# Patient Record
Sex: Male | Born: 1990 | Race: White | Hispanic: No | Marital: Single | Smoking: Never smoker
Health system: Southern US, Community
[De-identification: ages and names within clinical notes are randomized; demographics above are authoritative.]

## PROBLEM LIST (undated history)

## (undated) HISTORY — PX: NO PAST SURGERIES: SHX2092

---

## 2008-01-08 ENCOUNTER — Emergency Department: Payer: Self-pay | Admitting: Emergency Medicine

## 2010-05-15 ENCOUNTER — Emergency Department (HOSPITAL_COMMUNITY): Payer: Self-pay

## 2010-05-15 ENCOUNTER — Encounter (HOSPITAL_COMMUNITY): Payer: Self-pay | Admitting: Radiology

## 2010-05-15 ENCOUNTER — Emergency Department (HOSPITAL_COMMUNITY)
Admission: EM | Admit: 2010-05-15 | Discharge: 2010-05-15 | Disposition: A | Discharge status: Home / Self Care | Payer: Worker's Compensation | Attending: Emergency Medicine | Admitting: Emergency Medicine

## 2010-05-15 DIAGNOSIS — M542 Cervicalgia: Secondary | ICD-10-CM | POA: Insufficient documentation

## 2010-05-15 DIAGNOSIS — IMO0002 Reserved for concepts with insufficient information to code with codable children: Secondary | ICD-10-CM | POA: Insufficient documentation

## 2010-05-15 DIAGNOSIS — M25519 Pain in unspecified shoulder: Secondary | ICD-10-CM | POA: Insufficient documentation

## 2010-05-15 DIAGNOSIS — R071 Chest pain on breathing: Secondary | ICD-10-CM | POA: Insufficient documentation

## 2010-05-15 DIAGNOSIS — Y929 Unspecified place or not applicable: Secondary | ICD-10-CM | POA: Insufficient documentation

## 2010-05-15 DIAGNOSIS — W138XXA Fall from, out of or through other building or structure, initial encounter: Secondary | ICD-10-CM | POA: Insufficient documentation

## 2010-05-15 DIAGNOSIS — R404 Transient alteration of awareness: Secondary | ICD-10-CM | POA: Insufficient documentation

## 2010-05-15 LAB — CBC
HCT: 47.1 % (ref 39.0–52.0)
MCHC: 34.8 g/dL (ref 30.0–36.0)
MCV: 87.7 fL (ref 78.0–100.0)
RDW: 12.6 % (ref 11.5–15.5)

## 2010-05-15 LAB — DIFFERENTIAL
Eosinophils Relative: 2 % (ref 0–5)
Lymphocytes Relative: 23 % (ref 12–46)
Lymphs Abs: 2.3 10*3/uL (ref 0.7–4.0)
Monocytes Absolute: 0.5 10*3/uL (ref 0.1–1.0)

## 2010-05-15 LAB — BASIC METABOLIC PANEL
BUN: 10 mg/dL (ref 6–23)
Calcium: 9.9 mg/dL (ref 8.4–10.5)
GFR calc non Af Amer: >60 mL/min (ref >60)
Glucose, Bld: 90 mg/dL (ref 70–99)

## 2014-02-02 ENCOUNTER — Emergency Department: Payer: Self-pay | Admitting: Emergency Medicine

## 2015-08-08 ENCOUNTER — Encounter: Payer: Self-pay | Admitting: Family Medicine

## 2015-08-08 ENCOUNTER — Ambulatory Visit (INDEPENDENT_AMBULATORY_CARE_PROVIDER_SITE_OTHER): Payer: Self-pay | Admitting: Family Medicine

## 2015-08-08 ENCOUNTER — Telehealth: Payer: Self-pay | Admitting: Family Medicine

## 2015-08-08 VITALS — BP 126/84 | HR 104 | Temp 99.5°F | Ht 71.0 in | Wt 261.8 lb

## 2015-08-08 DIAGNOSIS — J020 Streptococcal pharyngitis: Secondary | ICD-10-CM | POA: Insufficient documentation

## 2015-08-08 LAB — POCT RAPID STREP A (OFFICE): Rapid Strep A Screen: POSITIVE — AB

## 2015-08-08 MED ORDER — AMOXICILLIN-POT CLAVULANATE 875-125 MG PO TABS: 1.0000 | ORAL_TABLET | Freq: Two times a day (BID) | ORAL | Status: DC

## 2015-08-08 MED ORDER — ONDANSETRON 4 MG PO TBDP: 4.0000 mg | ORAL_TABLET | Freq: Three times a day (TID) | ORAL | Status: DC | PRN

## 2015-08-08 NOTE — Assessment & Plan Note (Signed)
New problem. Rapid strep positive today. Treating with Augmentin.

## 2015-08-08 NOTE — Telephone Encounter (Signed)
Please advise, thanks.

## 2015-08-08 NOTE — Progress Notes (Signed)
   Subjective:  Patient ID: Jeffrey Ball, male    DOB: July 24, 1990  Age: 25 y.o. MRN: [id number]  CC: ST, Runny nose, Cough, Headache   HPI Jeffrey NiemannGerald Clive Bonds Ball is a 25 y.o. male presents to the clinic today with the above complaints.  Patient states he has been sick for the past 2 weeks. Has been expressing runny nose, cough, headache, fever, decreased energy, and body aches. He has more recently been experiencing severe sore throat. He's also had recent vomiting. He's taken several over-the-counter medications no improvement. No known exacerbating factors. He's had no relief with the over-the-counter medications. No other complaints at this time.  PMH, Surgical Hx, Family Hx, Social History reviewed and updated as below.  No PMH per patient.  Past Surgical History  Procedure Laterality Date  . No past surgeries     Patient denies any family history.   Social History  Substance Use Topics  . Smoking status: Never Smoker   . Smokeless tobacco: Current User    Types: Chew  . Alcohol Use: 0.6 - 1.2 oz/week    1-2 Standard drinks or equivalent per week   Review of Systems  HENT: Positive for tinnitus, trouble swallowing and voice change.   Gastrointestinal: Positive for vomiting.  Endocrine:       Increased thirst.  Neurological: Positive for dizziness, weakness and headaches.  All other systems reviewed and are negative.  Objective:   Today's Vitals: BP 126/84 mmHg  Pulse 104  Temp(Src) 99.5 F (37.5 C) (Oral)  Ht 5\' 11"  (1.803 m)  Wt 261 lb 12 oz (118.729 kg)  BMI 36.52 kg/m2  SpO2 97%  Physical Exam  Constitutional: He is oriented to person, place, and time. He appears well-developed and well-nourished. No distress.  HENT:  Head: Normocephalic and atraumatic.  Nose: Nose normal.  Oropharynx with erythema. Tonsillar hypertrophy and exudate noted. Normal TM's bilaterally.   Eyes: Conjunctivae are normal. No scleral icterus.  Neck: Neck supple.    Cardiovascular: Normal rate and regular rhythm.   No murmur heard. Pulmonary/Chest: Effort normal and breath sounds normal. He has no wheezes. He has no rales.  Abdominal: Soft. He exhibits no distension. There is no tenderness. There is no rebound and no guarding.  Musculoskeletal: Normal range of motion. He exhibits no edema.  Neurological: He is alert and oriented to person, place, and time.  Skin: Skin is warm and dry. No rash noted.  Psychiatric: He has a normal mood and affect.  Vitals reviewed.  Assessment & Plan:   Problem List Items Addressed This Visit    Acute streptococcal pharyngitis - Primary    New problem. Rapid strep positive today. Treating with Augmentin.      Relevant Orders   POCT rapid strep A (Completed)      Outpatient Encounter Prescriptions as of 08/08/2015  Medication Sig  . amoxicillin-clavulanate (AUGMENTIN) 875-125 MG tablet Take 1 tablet by mouth 2 (two) times daily.  . ondansetron (ZOFRAN ODT) 4 MG disintegrating tablet Take 1 tablet (4 mg total) by mouth every 8 (eight) hours as needed for nausea or vomiting.   No facility-administered encounter medications on file as of 08/08/2015.    Follow-up: PRN  Everlene OtherJayce Clytie Shetley DO [facility] [place]

## 2015-08-08 NOTE — Telephone Encounter (Signed)
Patient gave verbal that it was okay to speak with dad. i let patient and dad know that a verbal order would be called into [facility] [address]

## 2015-08-08 NOTE — Progress Notes (Signed)
Pre visit review using our clinic review tool, if applicable. No additional management support is needed unless otherwise documented below in the visit note. 

## 2015-08-08 NOTE — Patient Instructions (Signed)
Take the antibiotic as prescribed.  Use the zofran as needed for nausea and vomiting.  Call if you worsen or fail to improve.  Take care  Dr. Adriana Simasook

## 2015-08-08 NOTE — Telephone Encounter (Signed)
Pt dad called in about pt Rx that was prescribed today if it can called into another pharmacy the price is too much. Pharmacy is [facility] [address] [place]. Call dad @ [phone]. Thank you!

## 2015-09-17 ENCOUNTER — Telehealth: Payer: Self-pay | Admitting: Family Medicine

## 2015-09-17 NOTE — Telephone Encounter (Signed)
Pt called about needing a refill for amoxicillin-clavulanate (AUGMENTIN) 875-125 MG tablet.    Pharmacy is [id number] - [place] - [address]  Call pt @ [phone]. Thank you!

## 2015-09-17 NOTE — Telephone Encounter (Signed)
Pt states he will call back once her check his schedule.

## 2015-09-17 NOTE — Telephone Encounter (Signed)
Patient will need an appt for a refill on antibiotics, please advise patient. thanks

## 2016-11-21 ENCOUNTER — Ambulatory Visit (INDEPENDENT_AMBULATORY_CARE_PROVIDER_SITE_OTHER): Payer: Self-pay | Admitting: Family Medicine

## 2016-11-21 ENCOUNTER — Encounter: Payer: Self-pay | Admitting: Family Medicine

## 2016-11-21 ENCOUNTER — Ambulatory Visit: Payer: Self-pay | Admitting: Family Medicine

## 2016-11-21 VITALS — BP 118/80 | HR 98 | Temp 100.6°F | Resp 16 | Wt 254.4 lb

## 2016-11-21 DIAGNOSIS — J029 Acute pharyngitis, unspecified: Secondary | ICD-10-CM | POA: Insufficient documentation

## 2016-11-21 DIAGNOSIS — K120 Recurrent oral aphthae: Secondary | ICD-10-CM | POA: Insufficient documentation

## 2016-11-21 LAB — POCT RAPID STREP A (OFFICE): RAPID STREP A SCREEN: NEGATIVE

## 2016-11-21 MED ORDER — CEFDINIR 300 MG PO CAPS: 300.0000 mg | ORAL_CAPSULE | Freq: Two times a day (BID) | ORAL | 0 refills | Status: DC

## 2016-11-21 MED ORDER — SULFURIC ACID-SULF PHENOLICS 30-50 % MT SOLN: OROMUCOSAL | 1 refills | Status: DC

## 2016-11-21 NOTE — Progress Notes (Signed)
Subjective:  Patient ID: Jeffrey Ball, male    DOB: 21-May-1990  Age: 26 y.o. MRN: [id number]  CC: Sore throat, fever  HPI:  26 year old male presents with the above complaints.  Patient states he been sick since Tuesday morning. He's had severe sore throat, fever, bodyaches, fatigue. He's also had canker sores (left lower gumline and cheek). No reported sick contacts. No known exacerbating or relieving factors. No medications or interventions tried. No other associated symptoms. No other complaints or concerns at this time.  Social Hx   Social History   Social History  . Marital status: Single    Spouse name: N/A  . Number of children: N/A  . Years of education: N/A   Social History Main Topics  . Smoking status: Never Smoker  . Smokeless tobacco: Current User    Types: Chew  . Alcohol use 0.6 - 1.2 oz/week    1 - 2 Standard drinks or equivalent per week  . Drug use: No  . Sexual activity: Yes   Other Topics Concern  . None   Social History Narrative  . None    Review of Systems  Constitutional: Positive for fever.  HENT: Positive for sore throat.   Respiratory: Negative for cough.    Objective:  BP 118/80 (BP Location: Left Arm, Patient Position: Sitting, Cuff Size: Large)   Pulse 98   Temp (!) 100.6 F (38.1 C) (Oral)   Resp 16   Wt 254 lb 6 oz (115.4 kg)   SpO2 98%   BMI 35.48 kg/m   BP/Weight 11/21/2016 08/08/2015  Systolic BP 118 126  Diastolic BP 80 84  Wt. (Lbs) 254.38 261.75  BMI 35.48 36.52    Physical Exam  Constitutional: He is oriented to person, place, and time. He appears well-developed. No distress.  HENT:  Head: Normocephalic and atraumatic.  Enlarged tonsils with severe erythema and exudate. Canker sores noted in the mouth.  Eyes: Conjunctivae are normal. No scleral icterus.  Neck: Neck supple.  Cardiovascular:  Tachycardia. Regular rhythm. No murmur.  Pulmonary/Chest: Effort normal. He has no wheezes. He has no rales.    Lymphadenopathy:    He has no cervical adenopathy.  Neurological: He is alert and oriented to person, place, and time.  Psychiatric: He has a normal mood and affect.  Vitals reviewed.   Lab Results  Component Value Date   WBC 10.1 05/15/2010   HGB 16.4 05/15/2010   HCT 47.1 05/15/2010   PLT 238 05/15/2010   GLUCOSE 90 05/15/2010   NA 141 05/15/2010   K 3.7 05/15/2010   CL 102 05/15/2010   CREATININE 1.15 05/15/2010   BUN 10 05/15/2010   CO2 28 05/15/2010    Assessment & Plan:   Problem List Items Addressed This Visit    Pharyngitis - Primary    New acute problem. Rapid strep negative. However, I clinically suspect strep pharyngitis given physical exam and history. Treating with Omnicef.      Relevant Orders   POCT rapid strep A (Completed)   Canker sore    Treating with Debacterol.          Meds ordered this encounter  Medications  . cefdinir (OMNICEF) 300 MG capsule    Sig: Take 1 capsule (300 mg total) by mouth 2 (two) times daily.    Dispense:  14 capsule    Refill:  0  . Sulfuric Acid-Sulf Phenolics 30-50 % SOLN    Sig: Use per package instructions.  Dispense:  3 each    Refill:  1     Follow-up: PRN  Everlene OtherJayce Angelisse Riso DO [facility] [place]

## 2016-11-21 NOTE — Patient Instructions (Signed)
Medication as prescribed.  Take care  Dr. Norine Reddington  

## 2016-11-21 NOTE — Assessment & Plan Note (Signed)
Treating with Debacterol.  ?

## 2016-11-21 NOTE — Assessment & Plan Note (Signed)
New acute problem. Rapid strep negative. However, I clinically suspect strep pharyngitis given physical exam and history. Treating with Omnicef.

## 2018-03-07 ENCOUNTER — Encounter: Payer: Self-pay | Admitting: Emergency Medicine

## 2018-03-07 ENCOUNTER — Emergency Department
Admission: EM | Admit: 2018-03-07 | Discharge: 2018-03-07 | Disposition: A | Discharge status: Home / Self Care | Payer: Self-pay | Attending: Emergency Medicine | Admitting: Emergency Medicine

## 2018-03-07 ENCOUNTER — Emergency Department: Payer: Self-pay

## 2018-03-07 ENCOUNTER — Other Ambulatory Visit: Payer: Self-pay

## 2018-03-07 DIAGNOSIS — M545 Low back pain, unspecified: Secondary | ICD-10-CM

## 2018-03-07 MED ORDER — DEXAMETHASONE SODIUM PHOSPHATE 10 MG/ML IJ SOLN
20.0000 mg | Freq: Once | INTRAMUSCULAR | Status: AC
  Administered 2018-03-07: 20 mg via INTRAMUSCULAR
  Filled 2018-03-07: qty 2

## 2018-03-07 MED ORDER — PREDNISONE 10 MG (21) PO TBPK: ORAL_TABLET | ORAL | 0 refills | Status: AC

## 2018-03-07 MED ORDER — METHOCARBAMOL 500 MG PO TABS
1000.0000 mg | ORAL_TABLET | Freq: Once | ORAL | Status: AC
  Administered 2018-03-07: 1000 mg via ORAL
  Filled 2018-03-07: qty 2

## 2018-03-07 MED ORDER — METHOCARBAMOL 500 MG PO TABS: 500.0000 mg | ORAL_TABLET | Freq: Three times a day (TID) | ORAL | 0 refills | Status: AC | PRN

## 2018-03-07 NOTE — ED Provider Notes (Signed)
[facility] Emergency Department Provider Note  ____________________________________________  Time seen: Approximately 10:25 PM  I have reviewed the triage vital signs and the nursing notes.   HISTORY  Chief Complaint Back Pain    HPI Jeffrey Ball is a 27 y.o. male presents to the emergency department with midline low back pain in the distribution of L4-L5.  Patient denies numbness or tingling in the lower extremities but reports that back pain of any kind is atypical for him.  He denies bowel or bladder incontinence or saddle anesthesia.  No new falls or traumas.  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  No dysuria, hematuria or increased urinary frequency.  No prior history of nephrolithiasis.  He denies fever or history of IV drug use.  Patient denies prior surgeries to the low back.  Patient has been taking Ibuprofen at home.   History reviewed. No pertinent past medical history.  Patient Active Problem List   Diagnosis Date Noted  . Pharyngitis 11/21/2016  . Canker sore 11/21/2016    Past Surgical History:  Procedure Laterality Date  . NO PAST SURGERIES      Prior to Admission medications   Medication Sig Start Date End Date Taking? Authorizing Provider  methocarbamol (ROBAXIN) 500 MG tablet Take 1 tablet (500 mg total) by mouth every 8 (eight) hours as needed for up to 5 days. 03/07/18 03/12/18  Orvil Feil, PA-C  predniSONE (STERAPRED UNI-PAK 21 TAB) 10 MG (21) TBPK tablet Take 6 tabs the the 1st day. Take 6 tabs the the 2nd day. Take 5 tabs the the 3rd day. Take 5 tabs the 4th day. Take 4 tabs the the 5th day.Take 4 tabs the the 6th day.Take 3 tabs the 7th day.Take 3 tabs the 8th day. Take 2 tabs the 9th day. Take 2 tabs the 10th day. Take 1 tab the 11th day. Take 1 tab the 12th day. 03/07/18   Orvil Feil, PA-C    Allergies Patient has no known allergies.  History reviewed. No pertinent family  history.  Social History Social History   Tobacco Use  . Smoking status: Never Smoker  . Smokeless tobacco: Current User    Types: Chew  Substance Use Topics  . Alcohol use: Not Currently    Alcohol/week: 1.0 - 2.0 standard drinks    Types: 1 - 2 Standard drinks or equivalent per week  . Drug use: No     Review of Systems  Constitutional: No fever/chills Eyes: No visual changes. No discharge ENT: No upper respiratory complaints. Cardiovascular: no chest pain. Respiratory: no cough. No SOB. Gastrointestinal: No abdominal pain.  No nausea, no vomiting.  No diarrhea.  No constipation. Genitourinary: Negative for dysuria. No hematuria Musculoskeletal: Patient has low back pain.   Skin: Negative for rash, abrasions, lacerations, ecchymosis. Neurological: Negative for headaches, focal weakness or numbness.  ___________________________________  PHYSICAL EXAM:  VITAL SIGNS: ED Triage Vitals  Enc Vitals Group     BP 03/07/18 2146 (!) 153/90     Pulse Rate 03/07/18 2146 97     Resp 03/07/18 2146 18     Temp 03/07/18 2146 98.7 F (37.1 C)     Temp Source 03/07/18 2146 Oral     SpO2 03/07/18 2146 99 %     Weight 03/07/18 2146 277 lb (125.6 kg)     Height 03/07/18 2146 6' (1.829 m)     Head Circumference --  Peak Flow --      Pain Score 03/07/18 2149 6     Pain Loc --      Pain Edu? --      Excl. in GC? --      Constitutional: Alert and oriented. Well appearing and in no acute distress. Eyes: Conjunctivae are normal. PERRL. EOMI. Head: Atraumatic. Cardiovascular: Normal rate, regular rhythm. Normal S1 and S2.  Good peripheral circulation. Respiratory: Normal respiratory effort without tachypnea or retractions. Lungs CTAB. Good air entry to the bases with no decreased or absent breath sounds. Musculoskeletal: Patient has 5 out of 5 strength in the lower extremities.  He does have midline lumbar spine tenderness at L4-L5.  Negative straight leg raise bilaterally.    Neurologic:  Normal speech and language. No gross focal neurologic deficits are appreciated.  Skin:  Skin is warm, dry and intact. No rash noted. Psychiatric: Mood and affect are normal. Speech and behavior are normal. Patient exhibits appropriate insight and judgement.   ____________________________________________   LABS (all labs ordered are listed, but only abnormal results are displayed)  Labs Reviewed - No data to display ____________________________________________  EKG   ____________________________________________  RADIOLOGY I personally viewed and evaluated these images as part of my medical decision making, as well as reviewing the written report by the radiologist.    Dg Lumbar Spine 2-3 Views  Result Date: 03/07/2018 CLINICAL DATA:  Midline low back pain.  No known injury. EXAM: LUMBAR SPINE - 2-3 VIEW COMPARISON:  None. FINDINGS: The alignment is maintained. Vertebral body heights are normal. There is no listhesis. The posterior elements are intact. Lumbar disc spaces are preserved. No fracture. Sacroiliac joints are symmetric and normal. IMPRESSION: Negative radiographs of lumbar spine. Electronically Signed   By: Narda RutherfordMelanie  Sanford M.D.   On: 03/07/2018 23:06    ____________________________________________    PROCEDURES  Procedure(s) performed:    Procedures    Medications  dexamethasone (DECADRON) injection 20 mg (20 mg Intramuscular Given 03/07/18 2301)  methocarbamol (ROBAXIN) tablet 1,000 mg (1,000 mg Oral Given 03/07/18 2300)     ____________________________________________   INITIAL IMPRESSION / ASSESSMENT AND PLAN / ED COURSE  Pertinent labs & imaging results that were available during my care of the patient were reviewed by me and considered in my medical decision making (see chart for details).  Review of the [place] CSRS was performed in accordance of the [facility] prior to dispensing any controlled drugs.      Assessment and Plan: Low Back  Pain:  Patient presents to the emergency department with midline low back pain for the past week.  X-ray examination reveals no acute bony abnormalities.  Neurologic exam and overall physical exam was reassuring.  Patient was given Decadron and Robaxin in the emergency department.  He was discharged with prednisone and Robaxin.  Vital signs are reassuring prior to discharge. All patient questions were answered.    ____________________________________________  FINAL CLINICAL IMPRESSION(S) / ED DIAGNOSES  Final diagnoses:  Acute bilateral low back pain without sciatica      NEW MEDICATIONS STARTED DURING THIS VISIT:  ED Discharge Orders         Ordered    predniSONE (STERAPRED UNI-PAK 21 TAB) 10 MG (21) TBPK tablet     03/07/18 2315    methocarbamol (ROBAXIN) 500 MG tablet  Every 8 hours PRN     03/07/18 2316              This chart was dictated using voice recognition  software/Dragon. Despite best efforts to proofread, errors can occur which can change the meaning. Any change was purely unintentional.    Orvil Feil, PA-C 03/07/18 Gwendlyn Deutscher, [place], MD 03/07/18 [phone]

## 2018-03-07 NOTE — ED Triage Notes (Signed)
Pt reports low back pain since Wednesday; denies injury; denies difficulty with bowel or bladder; stopped triage for pt to talk on the phone;

## 2018-05-13 ENCOUNTER — Emergency Department
Admission: EM | Admit: 2018-05-13 | Discharge: 2018-05-13 | Disposition: A | Discharge status: Home / Self Care | Payer: Self-pay | Attending: Emergency Medicine | Admitting: Emergency Medicine

## 2018-05-13 ENCOUNTER — Other Ambulatory Visit: Payer: Self-pay

## 2018-05-13 ENCOUNTER — Emergency Department: Payer: Self-pay

## 2018-05-13 ENCOUNTER — Encounter: Payer: Self-pay | Admitting: Emergency Medicine

## 2018-05-13 DIAGNOSIS — R112 Nausea with vomiting, unspecified: Secondary | ICD-10-CM

## 2018-05-13 DIAGNOSIS — R1084 Generalized abdominal pain: Secondary | ICD-10-CM

## 2018-05-13 DIAGNOSIS — R55 Syncope and collapse: Secondary | ICD-10-CM

## 2018-05-13 DIAGNOSIS — R42 Dizziness and giddiness: Secondary | ICD-10-CM | POA: Insufficient documentation

## 2018-05-13 DIAGNOSIS — R197 Diarrhea, unspecified: Secondary | ICD-10-CM | POA: Insufficient documentation

## 2018-05-13 DIAGNOSIS — R05 Cough: Secondary | ICD-10-CM | POA: Insufficient documentation

## 2018-05-13 DIAGNOSIS — F17220 Nicotine dependence, chewing tobacco, uncomplicated: Secondary | ICD-10-CM | POA: Insufficient documentation

## 2018-05-13 LAB — URINALYSIS, COMPLETE (UACMP) WITH MICROSCOPIC
BILIRUBIN URINE: NEGATIVE
Bacteria, UA: NONE SEEN
Glucose, UA: NEGATIVE mg/dL
HGB URINE DIPSTICK: NEGATIVE
KETONES UR: 5 mg/dL — AB
LEUKOCYTES UA: NEGATIVE
Nitrite: NEGATIVE
PH: 5 (ref 5.0–8.0)
Protein, ur: NEGATIVE mg/dL
SQUAMOUS EPITHELIAL / LPF: NONE SEEN (ref 0–5)
Specific Gravity, Urine: >1.046 — ABNORMAL HIGH (ref 1.005–1.030)

## 2018-05-13 LAB — COMPREHENSIVE METABOLIC PANEL
ALBUMIN: 5.1 g/dL — AB (ref 3.5–5.0)
ALT: 29 U/L (ref 0–44)
ANION GAP: 7 (ref 5–15)
AST: 25 U/L (ref 15–41)
Alkaline Phosphatase: 67 U/L (ref 38–126)
BILIRUBIN TOTAL: 1.2 mg/dL (ref 0.3–1.2)
BUN: 19 mg/dL (ref 6–20)
CO2: 28 mmol/L (ref 22–32)
Calcium: 9.6 mg/dL (ref 8.9–10.3)
Chloride: 105 mmol/L (ref 98–111)
Creatinine, Ser: 1.16 mg/dL (ref 0.61–1.24)
GFR calc Af Amer: >60 mL/min (ref >60)
Glucose, Bld: 112 mg/dL — ABNORMAL HIGH (ref 70–99)
POTASSIUM: 4.1 mmol/L (ref 3.5–5.1)
Sodium: 140 mmol/L (ref 135–145)
TOTAL PROTEIN: 8.4 g/dL — AB (ref 6.5–8.1)

## 2018-05-13 LAB — CBC
HCT: 49.8 % (ref 39.0–52.0)
HEMOGLOBIN: 17.1 g/dL — AB (ref 13.0–17.0)
MCH: 31 pg (ref 26.0–34.0)
MCHC: 34.3 g/dL (ref 30.0–36.0)
MCV: 90.4 fL (ref 80.0–100.0)
Platelets: 221 10*3/uL (ref 150–400)
RBC: 5.51 MIL/uL (ref 4.22–5.81)
RDW: 12.4 % (ref 11.5–15.5)
WBC: 12.9 10*3/uL — AB (ref 4.0–10.5)
nRBC: 0 % (ref 0.0–0.2)

## 2018-05-13 LAB — LIPASE, BLOOD: LIPASE: 27 U/L (ref 11–51)

## 2018-05-13 LAB — TROPONIN I: Troponin I: <0.03 ng/mL (ref <0.03)

## 2018-05-13 MED ORDER — IOPAMIDOL (ISOVUE-300) INJECTION 61%
100.0000 mL | Freq: Once | INTRAVENOUS | Status: AC | PRN
  Administered 2018-05-13: 100 mL via INTRAVENOUS

## 2018-05-13 MED ORDER — ONDANSETRON HCL 4 MG PO TABS: 4.0000 mg | ORAL_TABLET | Freq: Every day | ORAL | 0 refills | Status: AC | PRN

## 2018-05-13 MED ORDER — SODIUM CHLORIDE 0.9 % IV BOLUS
1000.0000 mL | Freq: Once | INTRAVENOUS | Status: AC
  Administered 2018-05-13: 1000 mL via INTRAVENOUS

## 2018-05-13 MED ORDER — ONDANSETRON HCL 4 MG/2ML IJ SOLN
4.0000 mg | Freq: Once | INTRAMUSCULAR | Status: AC
  Administered 2018-05-13: 4 mg via INTRAVENOUS
  Filled 2018-05-13: qty 2

## 2018-05-13 MED ORDER — SODIUM CHLORIDE 0.9% FLUSH
3.0000 mL | Freq: Once | INTRAVENOUS | Status: AC
  Administered 2018-05-13: 3 mL via INTRAVENOUS

## 2018-05-13 MED ORDER — DICYCLOMINE HCL 20 MG PO TABS: 20.0000 mg | ORAL_TABLET | Freq: Three times a day (TID) | ORAL | 0 refills | Status: AC | PRN

## 2018-05-13 NOTE — ED Triage Notes (Signed)
Pt arrived via EMS from home where pt has had N/V/D, fever x5 days and tonight pt had syncopal episode where he "blacked out" and hit head onto the door frame. Pt is A&O x4 on arrival. No bleeding, nor laceration noted.

## 2018-05-13 NOTE — ED Notes (Signed)
EDP in with patient 

## 2018-05-13 NOTE — ED Notes (Signed)
Patient transported to CT 

## 2018-05-13 NOTE — ED Notes (Signed)
Patient returned from CT

## 2018-05-13 NOTE — ED Provider Notes (Signed)
[facility] Emergency Department Provider Note  ____________________________________________   First MD Initiated Contact with Patient 05/13/18 2120     (approximate)  I have reviewed the triage vital signs and the nursing notes.   HISTORY  Chief Complaint Abdominal Pain; Emesis; Fever; and Loss of Consciousness   HPI Jeffrey Ball is a 28 y.o. male without any chronic medical conditions was presented emergency department today with approximately 5 days of nausea vomiting diarrhea and abdominal cramping.  He says that he also passed out this evening after vomiting in his bathtub.  He says that he had his head at that time.  Unclear of how long he was unconscious.  Denying any chest pain or shortness of breath at this time.  No known sick contacts.  No known flu exposure.  Does not report any burning with urination.  No recent hospitalizations or antibiotics.  Patient says that his last bowel movement was just prior to arrival to the emergency department.  Says that he is also seen blood streaks in his vomitus.  Says that his abdominal pain at this time is periumbilical, crampy and approximately an 8 out of 10.   History reviewed. No pertinent past medical history.  Patient Active Problem List   Diagnosis Date Noted  . Pharyngitis 11/21/2016  . Canker sore 11/21/2016    Past Surgical History:  Procedure Laterality Date  . NO PAST SURGERIES      Prior to Admission medications   Medication Sig Start Date End Date Taking? Authorizing Provider  predniSONE (STERAPRED UNI-PAK 21 TAB) 10 MG (21) TBPK tablet Take 6 tabs the the 1st day. Take 6 tabs the the 2nd day. Take 5 tabs the the 3rd day. Take 5 tabs the 4th day. Take 4 tabs the the 5th day.Take 4 tabs the the 6th day.Take 3 tabs the 7th day.Take 3 tabs the 8th day. Take 2 tabs the 9th day. Take 2 tabs the 10th day. Take 1 tab the 11th day. Take 1 tab the 12th day. 03/07/18   Orvil FeilWoods, Jaclyn M, PA-C     Allergies Patient has no known allergies.  History reviewed. No pertinent family history.  Social History Social History   Tobacco Use  . Smoking status: Never Smoker  . Smokeless tobacco: Current User    Types: Chew  Substance Use Topics  . Alcohol use: Not Currently    Alcohol/week: 1.0 - 2.0 standard drinks    Types: 1 - 2 Standard drinks or equivalent per week  . Drug use: No    Review of Systems  Constitutional: No fever/chills Eyes: No visual changes. ENT: No sore throat. Cardiovascular: Denies chest pain. Respiratory: Denies shortness of breath. Gastrointestinal:  No constipation. Genitourinary: Negative for dysuria. Musculoskeletal: Negative for back pain. Skin: Negative for rash. Neurological: Negative for headaches, focal weakness or numbness.   ____________________________________________   PHYSICAL EXAM:  VITAL SIGNS: ED Triage Vitals  Enc Vitals Group     BP 05/13/18 2122 124/88     Pulse Rate 05/13/18 2124 97     Resp 05/13/18 2124 (!) 22     Temp 05/13/18 2205 98.3 F (36.8 C)     Temp Source 05/13/18 2205 Oral     SpO2 05/13/18 2124 94 %     Weight 05/13/18 2235 274 lb (124.3 kg)     Height 05/13/18 2235 6' (1.829 m)     Head Circumference --      Peak Flow --  Pain Score --      Pain Loc --      Pain Edu? --      Excl. in GC? --     Constitutional: Alert and oriented. Well appearing and in no acute distress. Eyes: Conjunctivae are normal.  Head: Atraumatic. Nose: No congestion/rhinnorhea. Mouth/Throat: Mucous membranes are moist.  Neck: No stridor.   Cardiovascular: Normal rate, regular rhythm. Grossly normal heart sounds.  Good peripheral circulation. Respiratory: Normal respiratory effort.  No retractions. Lungs CTAB. Gastrointestinal: Soft with mild generalized tenderness to palpation without any focal tenderness.  No distention. No  Musculoskeletal: No lower extremity tenderness nor edema.  No joint  effusions. Neurologic:  Normal speech and language. No gross focal neurologic deficits are appreciated. Skin:  Skin is warm, dry and intact. No rash noted. Psychiatric: Mood and affect are normal. Speech and behavior are normal.  ____________________________________________   LABS (all labs ordered are listed, but only abnormal results are displayed)  Labs Reviewed  COMPREHENSIVE METABOLIC PANEL - Abnormal; Notable for the following components:      Result Value   Glucose, Bld 112 (*)    Total Protein 8.4 (*)    Albumin 5.1 (*)    All other components within normal limits  CBC - Abnormal; Notable for the following components:   WBC 12.9 (*)    Hemoglobin 17.1 (*)    All other components within normal limits  LIPASE, BLOOD  TROPONIN I  URINALYSIS, COMPLETE (UACMP) WITH MICROSCOPIC   ____________________________________________  EKG  ED ECG REPORT I, Arelia Longest, the attending physician, personally viewed and interpreted this ECG.   Date: 05/13/2018  EKG Time: 2122  Rate: 97  Rhythm: normal sinus rhythm  Axis: Normal  Intervals:none  ST&T Change: No ST segment elevation or depression.  No abnormal T wave inversion.  ____________________________________________  RADIOLOGY  CAT scan of the abdomen, head and neck without any acute finding. ____________________________________________   PROCEDURES  Procedure(s) performed:   Procedures  Critical Care performed:   ____________________________________________   INITIAL IMPRESSION / ASSESSMENT AND PLAN / ED COURSE  Pertinent labs & imaging results that were available during my care of the patient were reviewed by me and considered in my medical decision making (see chart for details).  Differential diagnosis includes, but is not limited to, acute appendicitis, renal colic, testicular torsion, urinary tract infection/pyelonephritis, prostatitis,  epididymitis, diverticulitis, small bowel obstruction or  ileus, colitis, abdominal aortic aneurysm, gastroenteritis, hernia, etc. As part of my medical decision making, I reviewed the following data within the electronic MEDICAL RECORD NUMBER Notes from prior ED visits  ----------------------------------------- 11:10 PM on 05/13/2018 -----------------------------------------  Patient at this time with reassuring vital signs.  Says that he still feels slightly lightheaded but improved.  Tolerating crackers and water at this time.  Likely viral illness.  No episodes of diarrhea or vomiting in the emergency department.  Will be discharged with Zofran as well as Bentyl.  Patient advised to drink plenty of fluids at home.  He is understanding of diagnosis well treatment and willing to comply.  Likely vasovagal episode causing him to pass out earlier this evening.  Very reassuring EKG as well as negative troponin. ____________________________________________   FINAL CLINICAL IMPRESSION(S) / ED DIAGNOSES  Syncope.  Nausea vomiting and diarrhea.  Abdominal pain.   NEW MEDICATIONS STARTED DURING THIS VISIT:  New Prescriptions   No medications on file     Note:  This document was prepared using Dragon voice recognition software  and may include unintentional dictation errors.     Myrna BlazerSchaevitz, Eman Morimoto [place], MD 05/13/18 [phone]

## 2020-07-06 IMAGING — CR DG LUMBAR SPINE 2-3V
3 series · 3 of 3 positions shown · non-contrast
Comparison: None.

CLINICAL DATA: Midline low back pain.  No known injury.

EXAM:
LUMBAR SPINE - 2-3 VIEW

[l-spine ap]
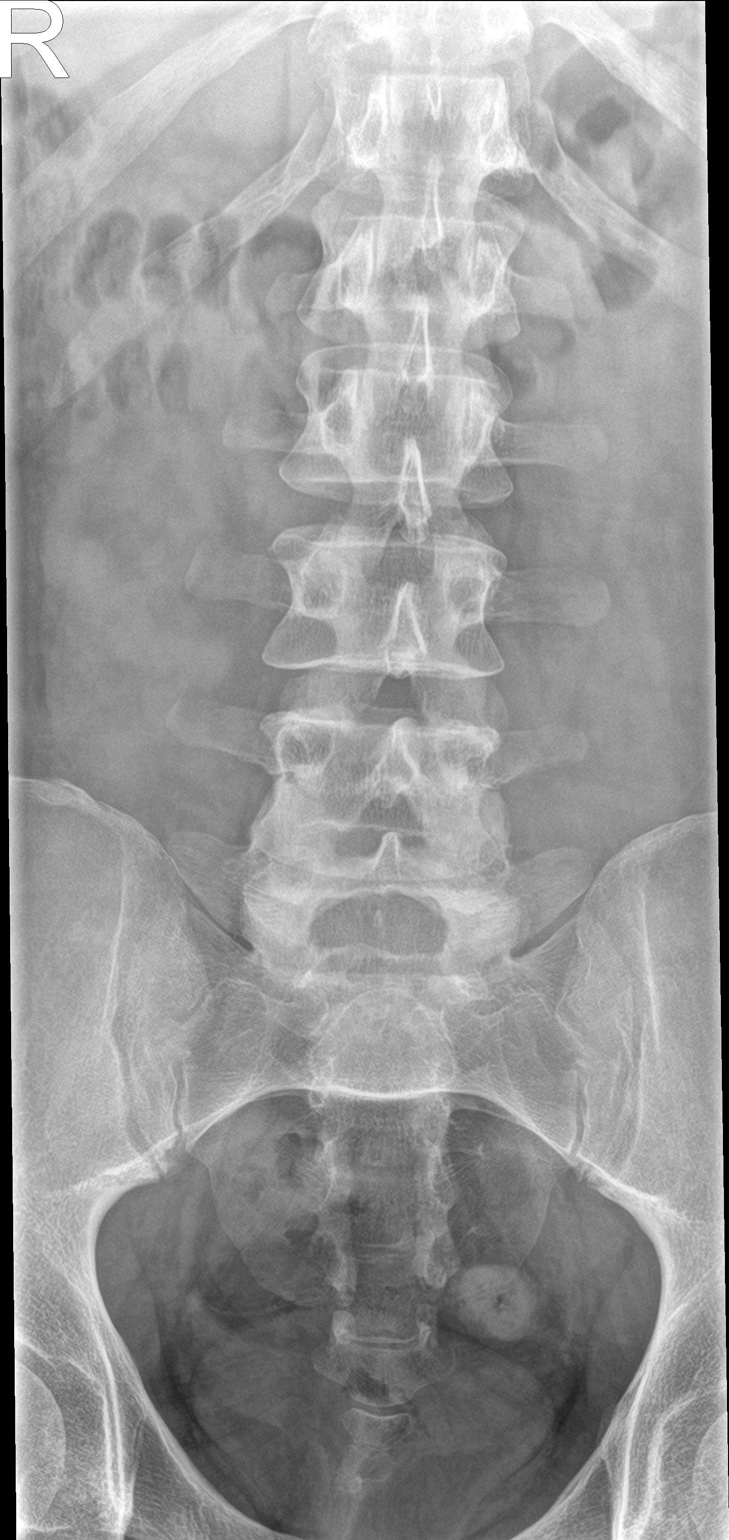

[l-spine lat]
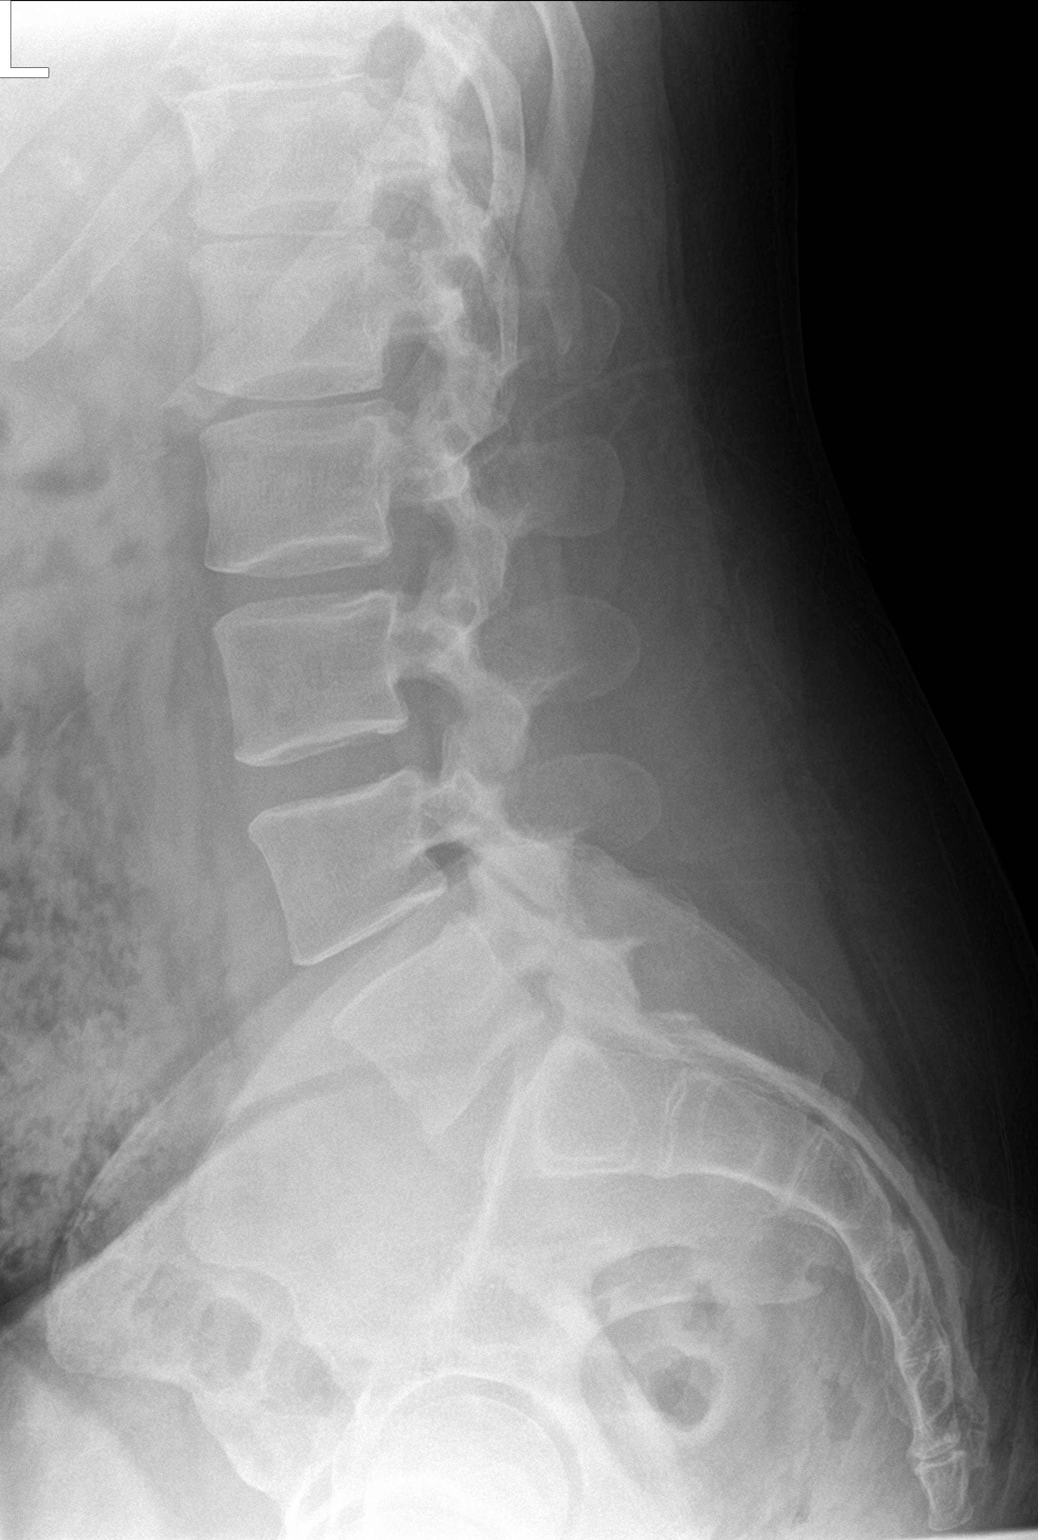

[l-spine spot]
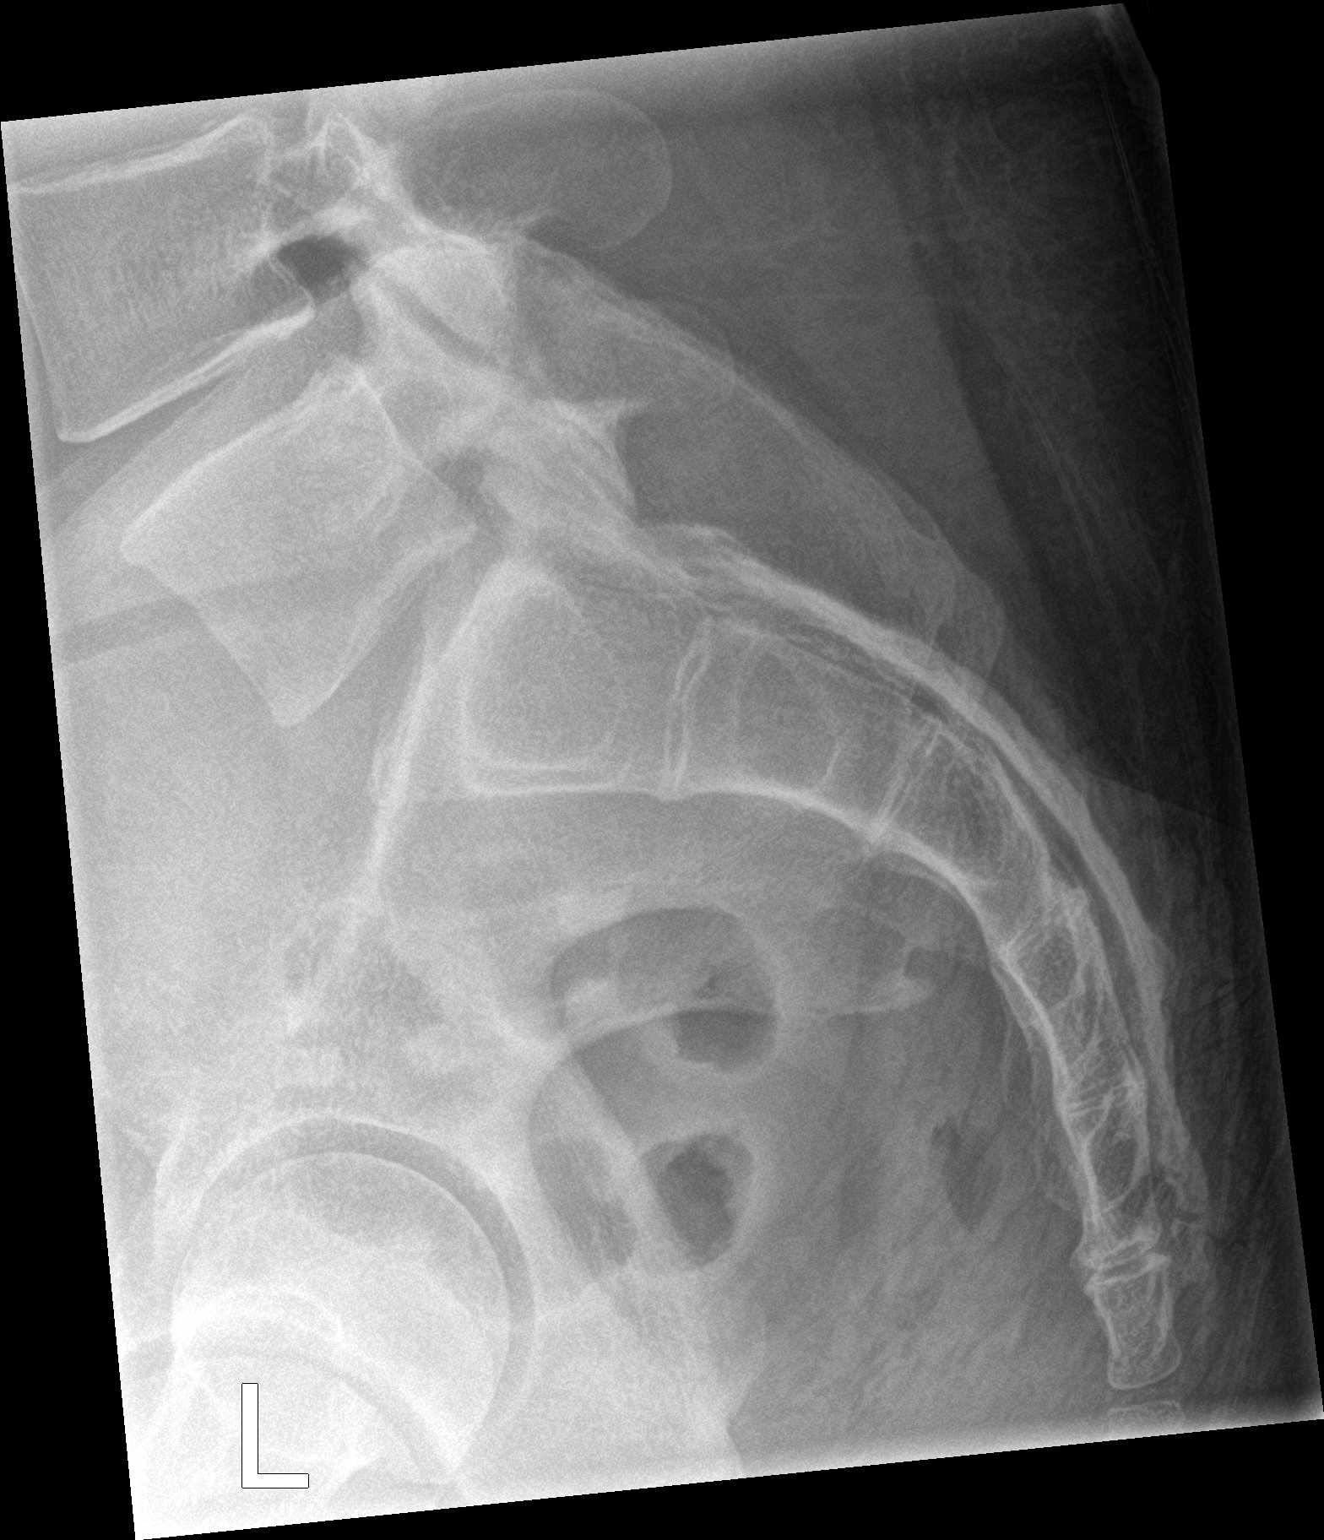

[3 of 3 positions shown; findings below may reference images not displayed]

FINDINGS: The alignment is maintained. Vertebral body heights are normal.
There is no listhesis. The posterior elements are intact. Lumbar
disc spaces are preserved. No fracture. Sacroiliac joints are
symmetric and normal.
IMPRESSION: Negative radiographs of lumbar spine.

## 2020-09-11 IMAGING — CT CT CERVICAL SPINE W/O CM
4 of 8 series · 12 of 33 positions shown, 13 images · non-contrast
Comparison: None.

CLINICAL DATA: Syncopal episode.

EXAM:
CT HEAD WITHOUT CONTRAST
CT CERVICAL SPINE WITHOUT CONTRAST
TECHNIQUE: Multidetector CT imaging of the head and cervical spine was
performed following the standard protocol without intravenous
contrast. Multiplanar CT image reconstructions of the cervical spine
were also generated.

[Series 7: c spine soft · axial · 0.35mm/px · z∈[-100,+4]mm · 3 of 104 slices shown]
[im 26/104  soft-tissue]
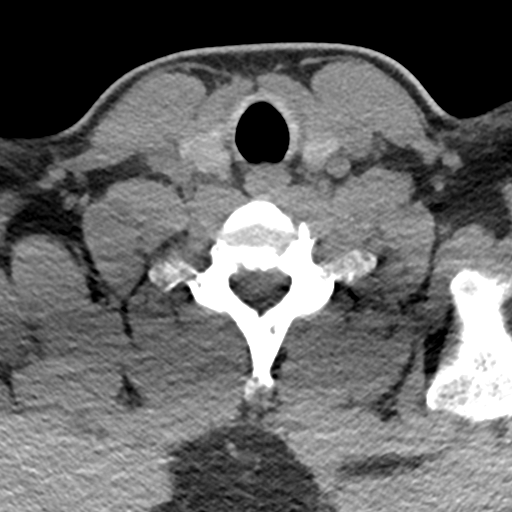
[im 52/104  soft-tissue]
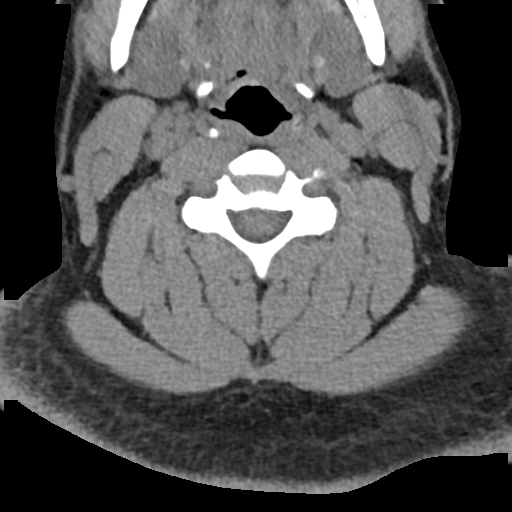
[im 78/104  soft-tissue]
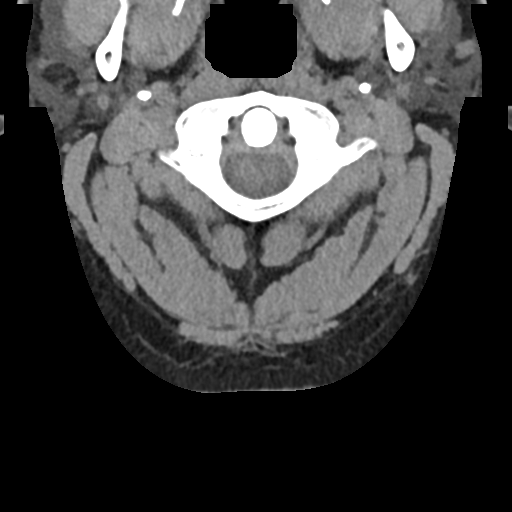

[Series 8: sagittal bone · sagittal · 0.30mm/px · 5 of 72 slices shown]
[im 12/72  bone]
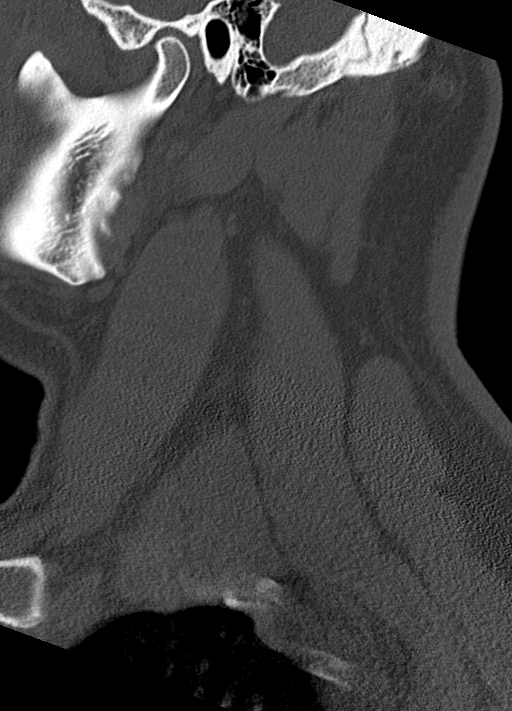
[im 24/72  bone]
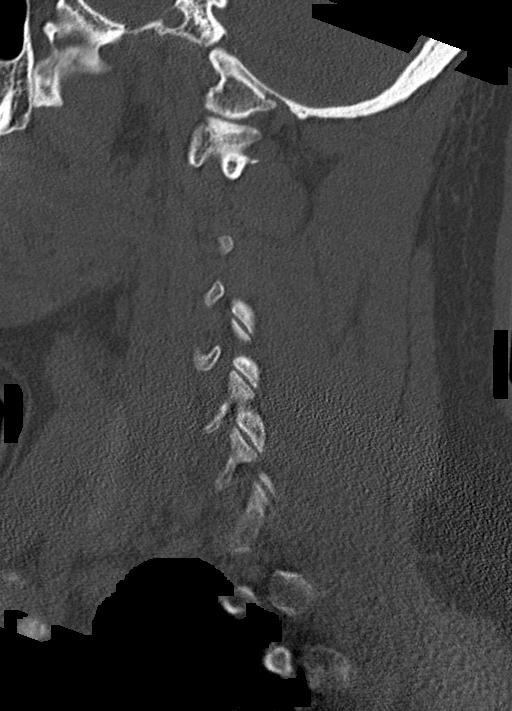
[im 36/72  bone]
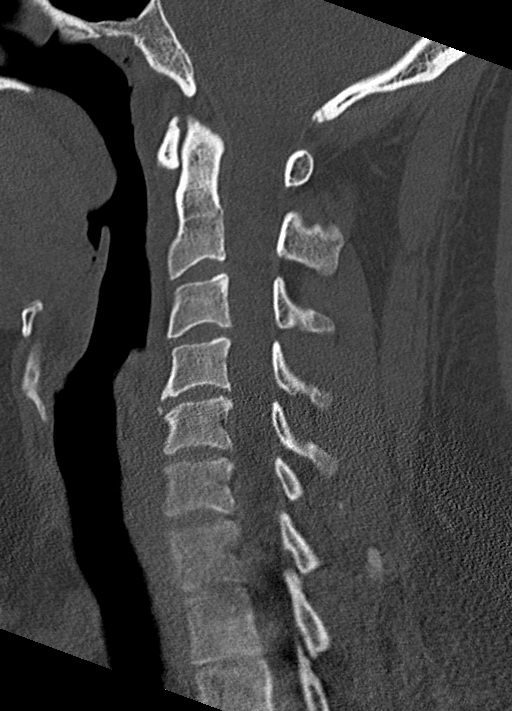
[im 48/72  bone]
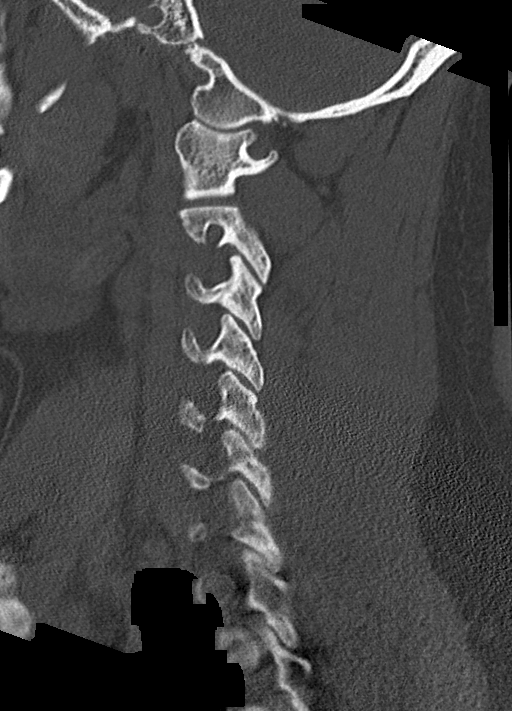
[im 60/72  bone]
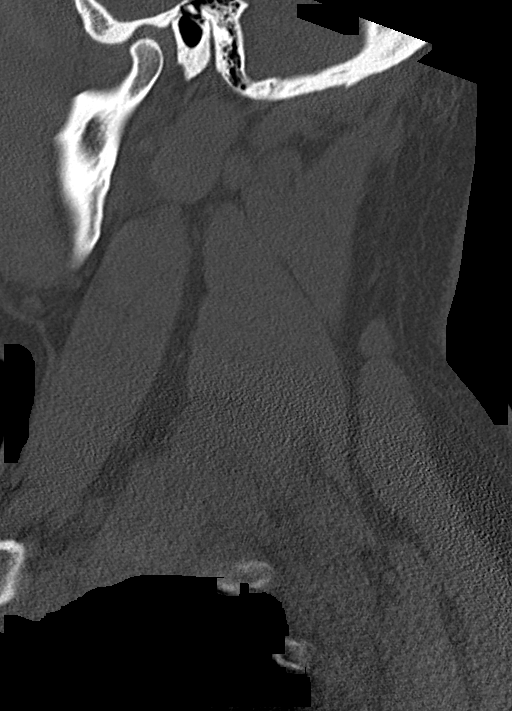

[Series 9: coronal bone · coronal · 0.25mm/px · 1 of 59 slices shown]
[im 30/59  bone]
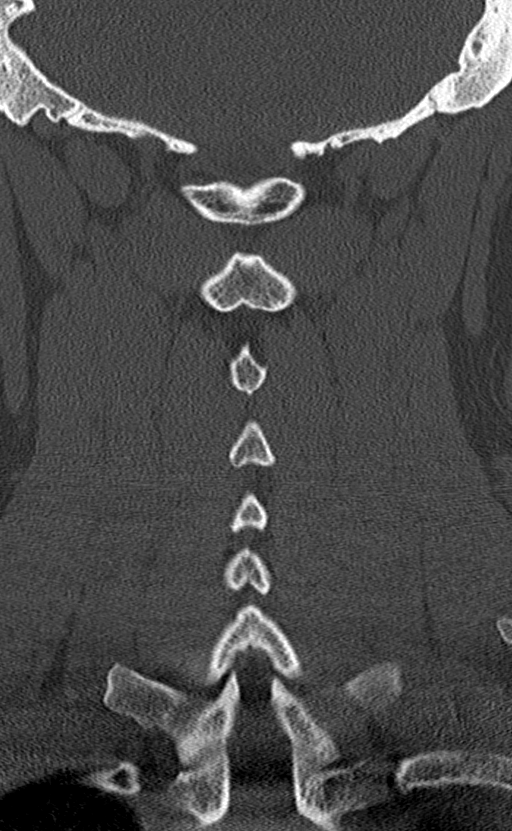

[Series 10: orthogonal bone · axial · 0.27mm/px · z∈[-120,-20]mm · 3 of 107 slices shown, 4 images]
[im 27/107  soft-tissue]
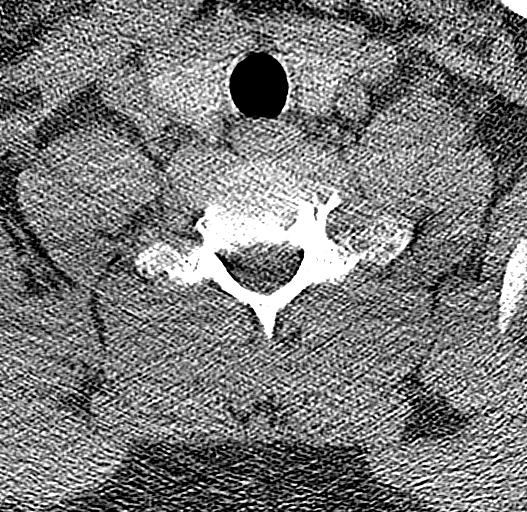
[im 27/107  bone]
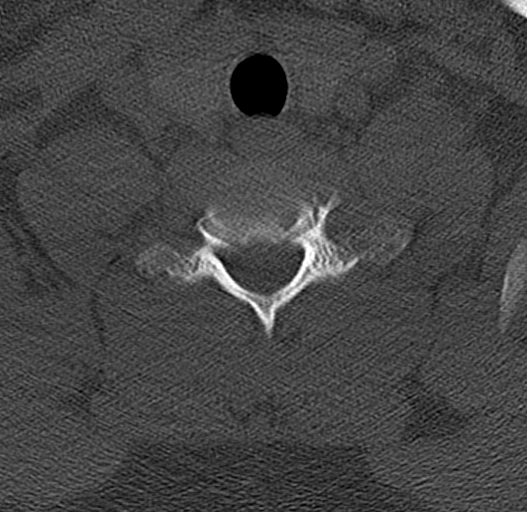
[im 54/107  bone]
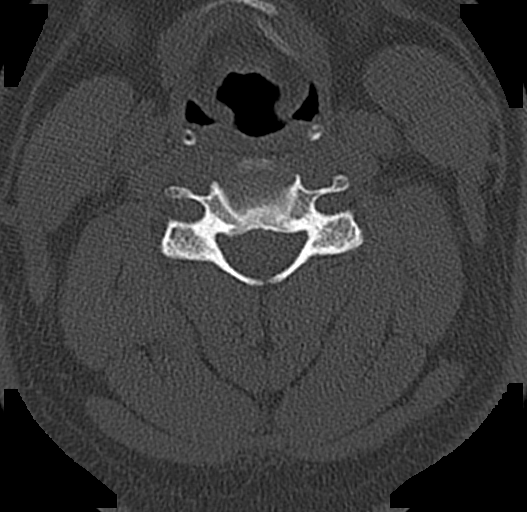
[im 80/107  bone]
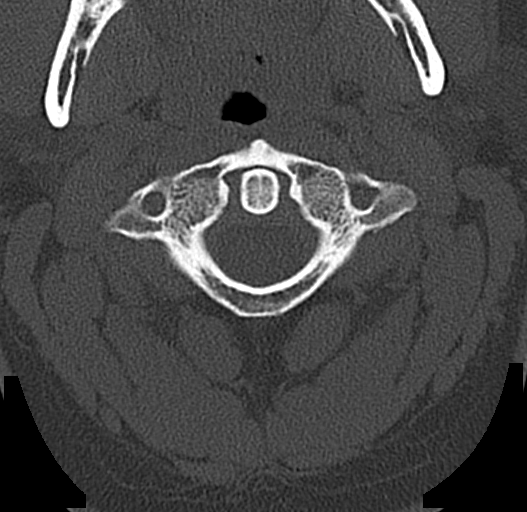

[12 of 33 positions shown; findings below may reference images not displayed]

FINDINGS: CT HEAD FINDINGS

Brain: No acute intracranial abnormality. Specifically, no
hemorrhage, hydrocephalus, mass lesion, acute infarction, or
significant intracranial injury.

Vascular: No hyperdense vessel or unexpected calcification.

Skull: No acute calvarial abnormality.

Sinuses/Orbits: No acute findings

Other: None

CT CERVICAL SPINE FINDINGS

Alignment: Normal alignment

Skull base and vertebrae: No acute fracture. No primary bone lesion
or focal pathologic process.

Soft tissues and spinal canal: No prevertebral fluid or swelling. No
visible canal hematoma.

Disc levels:  Maintained

Upper chest: Negative

Other: None
IMPRESSION: No intracranial abnormality.

No bony abnormality in the cervical spine.
